# Patient Record
Sex: Male | Born: 1970 | Race: Asian | Hispanic: No | Smoking: Former smoker
Health system: Southern US, Community
[De-identification: ages and names within clinical notes are randomized; demographics above are authoritative.]

## PROBLEM LIST (undated history)

## (undated) DIAGNOSIS — N400 Enlarged prostate without lower urinary tract symptoms: Secondary | ICD-10-CM

## (undated) DIAGNOSIS — E785 Hyperlipidemia, unspecified: Secondary | ICD-10-CM

## (undated) DIAGNOSIS — D693 Immune thrombocytopenic purpura: Secondary | ICD-10-CM

## (undated) DIAGNOSIS — H9190 Unspecified hearing loss, unspecified ear: Secondary | ICD-10-CM

## (undated) HISTORY — PX: HAIR TRANSPLANT: SHX1719

## (undated) HISTORY — DX: Unspecified hearing loss, unspecified ear: H91.90

## (undated) HISTORY — DX: Hyperlipidemia, unspecified: E78.5

## (undated) HISTORY — DX: Immune thrombocytopenic purpura: D69.3

## (undated) HISTORY — PX: INNER EAR SURGERY: SHX679

## (undated) HISTORY — DX: Benign prostatic hyperplasia without lower urinary tract symptoms: N40.0

---

## 2021-11-29 ENCOUNTER — Ambulatory Visit (INDEPENDENT_AMBULATORY_CARE_PROVIDER_SITE_OTHER): Payer: Commercial Managed Care - PPO

## 2021-11-29 ENCOUNTER — Encounter: Payer: Self-pay | Admitting: Nurse Practitioner

## 2021-11-29 ENCOUNTER — Ambulatory Visit (INDEPENDENT_AMBULATORY_CARE_PROVIDER_SITE_OTHER): Payer: Commercial Managed Care - PPO | Admitting: Nurse Practitioner

## 2021-11-29 VITALS — BP 122/80 | HR 81 | Temp 97.6°F | Ht 66.5 in | Wt 143.8 lb

## 2021-11-29 DIAGNOSIS — Z1211 Encounter for screening for malignant neoplasm of colon: Secondary | ICD-10-CM

## 2021-11-29 DIAGNOSIS — M79646 Pain in unspecified finger(s): Secondary | ICD-10-CM

## 2021-11-29 DIAGNOSIS — M25552 Pain in left hip: Secondary | ICD-10-CM

## 2021-11-29 DIAGNOSIS — Z1159 Encounter for screening for other viral diseases: Secondary | ICD-10-CM

## 2021-11-29 DIAGNOSIS — Z13220 Encounter for screening for lipoid disorders: Secondary | ICD-10-CM | POA: Diagnosis not present

## 2021-11-29 DIAGNOSIS — S0101XA Laceration without foreign body of scalp, initial encounter: Secondary | ICD-10-CM | POA: Diagnosis not present

## 2021-11-29 DIAGNOSIS — Z136 Encounter for screening for cardiovascular disorders: Secondary | ICD-10-CM

## 2021-11-29 DIAGNOSIS — N401 Enlarged prostate with lower urinary tract symptoms: Secondary | ICD-10-CM | POA: Insufficient documentation

## 2021-11-29 DIAGNOSIS — R7301 Impaired fasting glucose: Secondary | ICD-10-CM

## 2021-11-29 DIAGNOSIS — R972 Elevated prostate specific antigen [PSA]: Secondary | ICD-10-CM

## 2021-11-29 DIAGNOSIS — R3911 Hesitancy of micturition: Secondary | ICD-10-CM

## 2021-11-29 DIAGNOSIS — Z0001 Encounter for general adult medical examination with abnormal findings: Secondary | ICD-10-CM

## 2021-11-29 DIAGNOSIS — Z114 Encounter for screening for human immunodeficiency virus [HIV]: Secondary | ICD-10-CM

## 2021-11-29 DIAGNOSIS — Z0000 Encounter for general adult medical examination without abnormal findings: Secondary | ICD-10-CM

## 2021-11-29 LAB — CBC WITH DIFFERENTIAL/PLATELET
Basophils Absolute: 0.1 10*3/uL (ref 0.0–0.1)
Basophils Relative: 1 % (ref 0.0–3.0)
Eosinophils Absolute: 0.1 10*3/uL (ref 0.0–0.7)
Eosinophils Relative: 1 % (ref 0.0–5.0)
HCT: 47.7 % (ref 39.0–52.0)
Hemoglobin: 15.4 g/dL (ref 13.0–17.0)
Lymphocytes Relative: 42.6 % (ref 12.0–46.0)
Lymphs Abs: 2.7 10*3/uL (ref 0.7–4.0)
MCHC: 32.2 g/dL (ref 30.0–36.0)
MCV: 75.6 fl — ABNORMAL LOW (ref 78.0–100.0)
Monocytes Absolute: 0.4 10*3/uL (ref 0.1–1.0)
Monocytes Relative: 7 % (ref 3.0–12.0)
Neutro Abs: 3 10*3/uL (ref 1.4–7.7)
Neutrophils Relative %: 48.4 % (ref 43.0–77.0)
Platelets: 591 10*3/uL — ABNORMAL HIGH (ref 150.0–400.0)
RBC: 6.31 Mil/uL — ABNORMAL HIGH (ref 4.22–5.81)
RDW: 15.2 % (ref 11.5–15.5)
WBC: 6.3 10*3/uL (ref 4.0–10.5)

## 2021-11-29 LAB — COMPREHENSIVE METABOLIC PANEL
ALT: 15 U/L (ref 0–53)
AST: 18 U/L (ref 0–37)
Albumin: 4.5 g/dL (ref 3.5–5.2)
Alkaline Phosphatase: 49 U/L (ref 39–117)
BUN: 10 mg/dL (ref 6–23)
CO2: 29 mEq/L (ref 19–32)
Calcium: 9.6 mg/dL (ref 8.4–10.5)
Chloride: 104 mEq/L (ref 96–112)
Creatinine, Ser: 0.97 mg/dL (ref 0.40–1.50)
GFR: 91.41 mL/min (ref >60.00)
Glucose, Bld: 85 mg/dL (ref 70–99)
Potassium: 4.5 mEq/L (ref 3.5–5.1)
Sodium: 140 mEq/L (ref 135–145)
Total Bilirubin: 0.8 mg/dL (ref 0.2–1.2)
Total Protein: 7.5 g/dL (ref 6.0–8.3)

## 2021-11-29 LAB — LIPID PANEL
Cholesterol: 214 mg/dL — ABNORMAL HIGH (ref 0–200)
HDL: 51.5 mg/dL (ref >39.00)
LDL Cholesterol: 125 mg/dL — ABNORMAL HIGH (ref 0–99)
NonHDL: 162.87
Total CHOL/HDL Ratio: 4
Triglycerides: 191 mg/dL — ABNORMAL HIGH (ref 0.0–149.0)
VLDL: 38.2 mg/dL (ref 0.0–40.0)

## 2021-11-29 LAB — HEMOGLOBIN A1C: Hgb A1c MFr Bld: 5.9 % (ref 4.6–6.5)

## 2021-11-29 LAB — PSA: PSA: 5.51 ng/mL — ABNORMAL HIGH (ref 0.10–4.00)
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
## 2021-11-29 NOTE — Addendum Note (Signed)
Addended by: Vance Peper A on: 11/29/2021 01:03 PM   Modules accepted: Orders
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
## 2021-11-29 NOTE — Patient Instructions (Signed)
It was great to see you!  We are checking your labs today and will let you know the results via mychart/phone.   We are checking x-rays.   Let's follow-up in 1 year, sooner if you have concerns.  If a referral was placed today, you will be contacted for an appointment. Please note that routine referrals can sometimes take up to 3-4 weeks to process. Please call our office if you haven't heard anything after this time frame.  Take care,  Vance Peper, NP
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
## 2021-12-02 LAB — HEPATITIS C ANTIBODY
Hepatitis C Ab: NONREACTIVE
SIGNAL TO CUT-OFF: 0.09 (ref <1.00)

## 2021-12-02 LAB — HIV ANTIBODY (ROUTINE TESTING W REFLEX): HIV 1&2 Ab, 4th Generation: NONREACTIVE

## 2021-12-02 NOTE — Addendum Note (Signed)
Addended by: Rodman Pickle A on: 12/02/2021 08:14 AM   Modules accepted: Orders

## 2021-12-27 ENCOUNTER — Encounter: Payer: Self-pay | Admitting: Nurse Practitioner

## 2022-07-07 ENCOUNTER — Ambulatory Visit: Payer: Commercial Managed Care - PPO | Admitting: Family Medicine

## 2022-07-07 ENCOUNTER — Encounter: Payer: Self-pay | Admitting: Family Medicine

## 2022-07-07 VITALS — BP 136/70 | HR 92 | Temp 98.0°F | Ht 60.0 in | Wt 148.0 lb

## 2022-07-07 DIAGNOSIS — B029 Zoster without complications: Secondary | ICD-10-CM | POA: Diagnosis not present

## 2022-07-07 NOTE — Progress Notes (Signed)
  [facility] [address] [place] [address] Dept: [phone] Dept Fax: [phone]  Office Visit  Subjective:    Patient ID: Anthony Bowers, male    DOB: 10-Nov-1970, 52 y.o..   MRN: [id number]  No chief complaint on file.   History of Present Illness:  Patient is in today complaining of a rash that developed on his face starting last Thursday. The day prior he had been doing some outside work around a tree. He thinks there may have been a green vine in the tree and this may have been poison ivy. He has had some itching to the area of the rash. He has been applying Bendaryl lotion to the area. His eye has felt a little irritated.  Past Medical History: Patient Active Problem List   Diagnosis Date Noted   Benign prostatic hyperplasia with urinary hesitancy 11/29/2021   Past Surgical History:  Procedure Laterality Date   HAIR TRANSPLANT     INNER EAR SURGERY     Family History  Problem Relation Age of Onset   Hypertension Mother    Cancer Father        lung   Outpatient Medications Prior to Visit  Medication Sig Dispense Refill   alfuzosin (UROXATRAL) 10 MG 24 hr tablet      finasteride (PROSCAR) 5 MG tablet Take 5 mg by mouth daily.     finasteride (PROPECIA) 1 MG tablet Take 1 mg by mouth daily.     No facility-administered medications prior to visit.   No Known Allergies    Objective:   Today's Vitals   07/07/22 1548  BP: 136/70  Pulse: 92  Temp: 98 F (36.7 C)  TempSrc: Temporal  SpO2: 99%  Weight: 148 lb (67.1 kg)  Height: 5' (1.524 m)   Body mass index is 28.9 kg/m.   General: Well developed, well nourished. No acute distress. HEENT: Mr. Burkey has multiple small papules and crusted vesicles in a left V1 distribution. There are   lesions onto the bridge of the nose and the upper eyelid. The eye is not injected and has no discharge. Psych: Alert and oriented. Normal mood and  affect.  Health Maintenance Due  Topic Date Due   COLONOSCOPY (Pts 45-23yrs Insurance coverage will need to be confirmed)  Never done   Zoster Vaccines- Shingrix (1 of 2) Never done     Assessment & Plan:   1. Herpes zoster without complication Mr. Fiser's rash is classic for shingles involving a V1 distribution pattern for the left trigeminal nerve. As he is presenting after 72 hours form onset of the rash, there would not be benefit for staring an antiviral. he is not having significant pain. He can continue to use the topical Benadryl as needed. I strongly urged him to contact his eye doctor about being seen within the next 1-2 days for an eye examination. We discussed isolation from people who have no history of chickenpox vaccination or disease until all lesions are crusted.   Return for Contact eye doctor today about an examination in the next 24-48 hours.Haydee Salter, MD

## 2022-07-07 NOTE — Patient Instructions (Signed)

## 2022-12-05 ENCOUNTER — Encounter: Payer: Commercial Managed Care - PPO | Admitting: Nurse Practitioner

## 2022-12-24 ENCOUNTER — Encounter: Payer: Commercial Managed Care - PPO | Admitting: Nurse Practitioner

## 2023-01-16 ENCOUNTER — Encounter: Payer: Self-pay | Admitting: Nurse Practitioner

## 2023-01-16 ENCOUNTER — Ambulatory Visit: Payer: Commercial Managed Care - PPO | Admitting: Nurse Practitioner

## 2023-01-16 VITALS — BP 124/80 | HR 68 | Temp 97.6°F | Ht 66.0 in | Wt 148.4 lb

## 2023-01-16 DIAGNOSIS — Z23 Encounter for immunization: Secondary | ICD-10-CM | POA: Diagnosis not present

## 2023-01-16 DIAGNOSIS — N401 Enlarged prostate with lower urinary tract symptoms: Secondary | ICD-10-CM | POA: Diagnosis not present

## 2023-01-16 DIAGNOSIS — R3911 Hesitancy of micturition: Secondary | ICD-10-CM | POA: Diagnosis not present

## 2023-01-16 DIAGNOSIS — R7989 Other specified abnormal findings of blood chemistry: Secondary | ICD-10-CM

## 2023-01-16 DIAGNOSIS — E782 Mixed hyperlipidemia: Secondary | ICD-10-CM | POA: Diagnosis not present

## 2023-01-16 DIAGNOSIS — H60502 Unspecified acute noninfective otitis externa, left ear: Secondary | ICD-10-CM | POA: Diagnosis not present

## 2023-01-16 DIAGNOSIS — Z0000 Encounter for general adult medical examination without abnormal findings: Secondary | ICD-10-CM | POA: Diagnosis not present

## 2023-01-16 LAB — COMPREHENSIVE METABOLIC PANEL
ALT: 15 U/L (ref 0–53)
AST: 18 U/L (ref 0–37)
Albumin: 4.3 g/dL (ref 3.5–5.2)
Alkaline Phosphatase: 55 U/L (ref 39–117)
BUN: 15 mg/dL (ref 6–23)
CO2: 28 mEq/L (ref 19–32)
Calcium: 10 mg/dL (ref 8.4–10.5)
Chloride: 101 mEq/L (ref 96–112)
Creatinine, Ser: 1.1 mg/dL (ref 0.40–1.50)
GFR: 77.98 mL/min (ref >60.00)
Glucose, Bld: 77 mg/dL (ref 70–99)
Potassium: 4.6 mEq/L (ref 3.5–5.1)
Sodium: 138 mEq/L (ref 135–145)
Total Bilirubin: 0.5 mg/dL (ref 0.2–1.2)
Total Protein: 7 g/dL (ref 6.0–8.3)

## 2023-01-16 LAB — CBC WITH DIFFERENTIAL/PLATELET
Basophils Absolute: 0.1 10*3/uL (ref 0.0–0.1)
Basophils Relative: 1.1 % (ref 0.0–3.0)
Eosinophils Absolute: 0.2 10*3/uL (ref 0.0–0.7)
Eosinophils Relative: 2.3 % (ref 0.0–5.0)
HCT: 46.7 % (ref 39.0–52.0)
Hemoglobin: 14.8 g/dL (ref 13.0–17.0)
Lymphocytes Relative: 42.3 % (ref 12.0–46.0)
Lymphs Abs: 3.2 10*3/uL (ref 0.7–4.0)
MCHC: 31.7 g/dL (ref 30.0–36.0)
MCV: 76.6 fl — ABNORMAL LOW (ref 78.0–100.0)
Monocytes Absolute: 0.6 10*3/uL (ref 0.1–1.0)
Monocytes Relative: 8.1 % (ref 3.0–12.0)
Neutro Abs: 3.5 10*3/uL (ref 1.4–7.7)
Neutrophils Relative %: 46.2 % (ref 43.0–77.0)
Platelets: 620 10*3/uL — ABNORMAL HIGH (ref 150.0–400.0)
RBC: 6.09 Mil/uL — ABNORMAL HIGH (ref 4.22–5.81)
RDW: 15.3 % (ref 11.5–15.5)
WBC: 7.6 10*3/uL (ref 4.0–10.5)

## 2023-01-16 LAB — LIPID PANEL
Cholesterol: 235 mg/dL — ABNORMAL HIGH (ref 0–200)
HDL: 34.8 mg/dL — ABNORMAL LOW (ref >39.00)
Total CHOL/HDL Ratio: 7
Triglycerides: 839 mg/dL — ABNORMAL HIGH (ref 0.0–149.0)

## 2023-01-16 LAB — LDL CHOLESTEROL, DIRECT: Direct LDL: 63 mg/dL

## 2023-01-16 LAB — PSA: PSA: 4.23 ng/mL — ABNORMAL HIGH (ref 0.10–4.00)

## 2023-01-16 MED ORDER — OFLOXACIN 0.3 % OT SOLN: 5.0000 [drp] | Freq: Every day | OTIC | 0 refills | Status: DC

## 2023-01-16 NOTE — Addendum Note (Signed)
Addended by: Rodman Pickle A on: 01/16/2023 03:16 PM   Modules accepted: Orders

## 2023-01-16 NOTE — Patient Instructions (Signed)
It was great to see you!  You were referred to [facility]: Phone: [phone]  Start ear drops 5 drops in left ear daily for 7 days.   Let's follow-up in 1 year, sooner if you have concerns.  If a referral was placed today, you will be contacted for an appointment. Please note that routine referrals can sometimes take up to 3-4 weeks to process. Please call our office if you haven't heard anything after this time frame.  Take care,  Rodman Pickle, NP

## 2023-01-16 NOTE — Assessment & Plan Note (Signed)
He is still following with urology and is taking finasteride 5 mg daily.  Will check PSA levels today.  Continue collaboration recommendations from specialist

## 2023-01-16 NOTE — Assessment & Plan Note (Signed)
Health maintenance reviewed and updated. Discussed nutrition, exercise. Follow-up 1 year.

## 2023-01-16 NOTE — Assessment & Plan Note (Signed)
Chronic, ongoing.  Will check CMP, CBC, lipid panel today.  He has started exercising regularly, swimming, and going to the gym.  Treat based on results.

## 2023-01-16 NOTE — Progress Notes (Signed)
BP 124/80 (BP Location: Left Arm)   Pulse 68   Temp 97.6 F (36.4 C)   Ht 5\' 6"  (1.676 m)   Wt 148 lb 6.4 oz (67.3 kg)   SpO2 98%   BMI 23.95 kg/m    Subjective:    Patient ID: Anthony Bowers, male    DOB: 04/24/1971, 52 y.o.   MRN: [id number]  CC: Chief Complaint  Patient presents with   Annual Exam    With fasting lab work    HPI: Anthony Bowers is a 52 y.o. male presenting on 01/16/2023 for comprehensive medical examination. Current medical complaints include: drainage from left ear  He has been experiencing some drainage in his left ear for the last few weeks.  He states that he has been swimming a lot in the pool.  He tried an over-the-counter swimmer's ear medication, however is still ongoing.  He denies pain and fevers.  He currently lives with: mom  Depression and Anxiety Screen done today and results listed below:     01/16/2023    9:51 AM 11/29/2021   10:10 AM  Depression screen PHQ 2/9  Decreased Interest 0 0  Down, Depressed, Hopeless 0 1  PHQ - 2 Score 0 1  Altered sleeping 0   Tired, decreased energy 0   Change in appetite 0   Feeling bad or failure about yourself  0   Trouble concentrating 0   Moving slowly or fidgety/restless 0   Suicidal thoughts 0   PHQ-9 Score 0       01/16/2023    9:51 AM  GAD 7 : Generalized Anxiety Score  Nervous, Anxious, on Edge 0  Control/stop worrying 0  Worry too much - different things 0  Trouble relaxing 0  Restless 0  Easily annoyed or irritable 0  Afraid - awful might happen 0  Total GAD 7 Score 0    The patient does not have a history of falls. I did not complete a risk assessment for falls. A plan of care for falls was not documented.   Past Medical History:  Past Medical History:  Diagnosis Date   Hearing loss    left ear    Surgical History:  Past Surgical History:  Procedure Laterality Date   HAIR TRANSPLANT     INNER EAR SURGERY      Medications:  Current Outpatient Medications on File  Prior to Visit  Medication Sig   finasteride (PROSCAR) 5 MG tablet Take 5 mg by mouth daily.   No current facility-administered medications on file prior to visit.    Allergies:  No Known Allergies  Social History:  Social History   Socioeconomic History   Marital status: Legally Separated    Spouse name: Not on file   Number of children: Not on file   Years of education: Not on file   Highest education level: Not on file  Occupational History   Not on file  Tobacco Use   Smoking status: Former    Current packs/day: 0.00    Types: Cigarettes    Quit date: 2016    Years since quitting: 8.5   Smokeless tobacco: Never  Vaping Use   Vaping status: Never Used  Substance and Sexual Activity   Alcohol use: Yes    Comment: socially   Drug use: Never   Sexual activity: Yes  Other Topics Concern   Not on file  Social History Narrative   Not on file   Social Determinants of  Health   Financial Resource Strain: Not on file  Food Insecurity: Not on file  Transportation Needs: Not on file  Physical Activity: Not on file  Stress: Not on file  Social Connections: Not on file  Intimate Partner Violence: Not on file   Social History   Tobacco Use  Smoking Status Former   Current packs/day: 0.00   Types: Cigarettes   Quit date: 2016   Years since quitting: 8.5  Smokeless Tobacco Never   Social History   Substance and Sexual Activity  Alcohol Use Yes   Comment: socially    Family History:  Family History  Problem Relation Age of Onset   Hypertension Mother    Cancer Father        lung    Past medical history, surgical history, medications, allergies, family history and social history reviewed with patient today and changes made to appropriate areas of the chart.   Review of Systems  Constitutional: Negative.   HENT:  Positive for ear discharge (using OTC ear drops).   Eyes: Negative.   Respiratory: Negative.    Cardiovascular: Negative.    Gastrointestinal: Negative.   Genitourinary:  Negative for dysuria.       Hesitancy, hard to start urine flow  Musculoskeletal: Negative.   Skin: Negative.   Neurological: Negative.   Psychiatric/Behavioral: Negative.     All other ROS negative except what is listed above and in the HPI.      Objective:    BP 124/80 (BP Location: Left Arm)   Pulse 68   Temp 97.6 F (36.4 C)   Ht 5\' 6"  (1.676 m)   Wt 148 lb 6.4 oz (67.3 kg)   SpO2 98%   BMI 23.95 kg/m   Wt Readings from Last 3 Encounters:  01/16/23 148 lb 6.4 oz (67.3 kg)  07/07/22 148 lb (67.1 kg)  11/29/21 143 lb 12.8 oz (65.2 kg)    Physical Exam Vitals and nursing note reviewed.  Constitutional:      General: He is not in acute distress.    Appearance: Normal appearance.  HENT:     Head: Normocephalic and atraumatic.     Right Ear: Tympanic membrane, ear canal and external ear normal.     Left Ear: External ear normal. Drainage present.     Ears:     Comments: Redness to your canal Eyes:     Conjunctiva/sclera: Conjunctivae normal.  Cardiovascular:     Rate and Rhythm: Normal rate and regular rhythm.     Pulses: Normal pulses.     Heart sounds: Normal heart sounds.  Pulmonary:     Effort: Pulmonary effort is normal.     Breath sounds: Normal breath sounds.  Abdominal:     Palpations: Abdomen is soft.     Tenderness: There is no abdominal tenderness.  Musculoskeletal:        General: Normal range of motion.     Cervical back: Normal range of motion and neck supple. No tenderness.     Right lower leg: No edema.     Left lower leg: No edema.  Lymphadenopathy:     Cervical: No cervical adenopathy.  Skin:    General: Skin is warm and dry.  Neurological:     General: No focal deficit present.     Mental Status: He is alert and oriented to person, place, and time.     Cranial Nerves: No cranial nerve deficit.     Coordination: Coordination normal.  Gait: Gait normal.  Psychiatric:        Mood and  Affect: Mood normal.        Behavior: Behavior normal.        Thought Content: Thought content normal.        Judgment: Judgment normal.     Results for orders placed or performed in visit on 11/29/21  CBC with Differential/Platelet  Result Value Ref Range   WBC 6.3 4.0 - 10.5 K/uL   RBC 6.31 (H) 4.22 - 5.81 Mil/uL   Hemoglobin 15.4 13.0 - 17.0 g/dL   HCT 70.6 23.7 - 62.8 %   MCV 75.6 (L) 78.0 - 100.0 fl   MCHC 32.2 30.0 - 36.0 g/dL   RDW 31.5 17.6 - 16.0 %   Platelets 591.0 (H) 150.0 - 400.0 K/uL   Neutrophils Relative % 48.4 43.0 - 77.0 %   Lymphocytes Relative 42.6 12.0 - 46.0 %   Monocytes Relative 7.0 3.0 - 12.0 %   Eosinophils Relative 1.0 0.0 - 5.0 %   Basophils Relative 1.0 0.0 - 3.0 %   Neutro Abs 3.0 1.4 - 7.7 K/uL   Lymphs Abs 2.7 0.7 - 4.0 K/uL   Monocytes Absolute 0.4 0.1 - 1.0 K/uL   Eosinophils Absolute 0.1 0.0 - 0.7 K/uL   Basophils Absolute 0.1 0.0 - 0.1 K/uL  Comprehensive metabolic panel  Result Value Ref Range   Sodium 140 135 - 145 mEq/L   Potassium 4.5 3.5 - 5.1 mEq/L   Chloride 104 96 - 112 mEq/L   CO2 29 19 - 32 mEq/L   Glucose, Bld 85 70 - 99 mg/dL   BUN 10 6 - 23 mg/dL   Creatinine, Ser 7.37 0.40 - 1.50 mg/dL   Total Bilirubin 0.8 0.2 - 1.2 mg/dL   Alkaline Phosphatase 49 39 - 117 U/L   AST 18 0 - 37 U/L   ALT 15 0 - 53 U/L   Total Protein 7.5 6.0 - 8.3 g/dL   Albumin 4.5 3.5 - 5.2 g/dL   GFR 10.62 >69.48 mL/min   Calcium 9.6 8.4 - 10.5 mg/dL  Hepatitis C antibody  Result Value Ref Range   Hepatitis C Ab NON-REACTIVE NON-REACTIVE   SIGNAL TO CUT-OFF 0.09 <1.00  HIV Antibody (routine testing w rflx)  Result Value Ref Range   HIV 1&2 Ab, 4th Generation NON-REACTIVE NON-REACTIVE  Lipid panel  Result Value Ref Range   Cholesterol 214 (H) 0 - 200 mg/dL   Triglycerides 546.2 (H) 0.0 - 149.0 mg/dL   HDL 70.35 >00.93 mg/dL   VLDL 81.8 0.0 - 29.9 mg/dL   LDL Cholesterol 371 (H) 0 - 99 mg/dL   Total CHOL/HDL Ratio 4    NonHDL 162.87    Hemoglobin A1c  Result Value Ref Range   Hgb A1c MFr Bld 5.9 4.6 - 6.5 %  PSA  Result Value Ref Range   PSA 5.51 (H) 0.10 - 4.00 ng/mL      Assessment & Plan:   Problem List Items Addressed This Visit       Genitourinary   Benign prostatic hyperplasia with urinary hesitancy    He is still following with urology and is taking finasteride 5 mg daily.  Will check PSA levels today.  Continue collaboration recommendations from specialist      Relevant Orders   PSA     Other   Mixed hyperlipidemia    Chronic, ongoing.  Will check CMP, CBC, lipid panel today.  He has started  exercising regularly, swimming, and going to the gym.  Treat based on results.      Relevant Orders   CBC with Differential/Platelet   Comprehensive metabolic panel   Lipid panel   Routine general medical examination at a health care facility - Primary    Health maintenance reviewed and updated. Discussed nutrition, exercise.  Follow-up 1 year.        Other Visit Diagnoses     Immunization due       Shingrix No. 1 given today.  He will come back in 2 to 6 months for nurse visit for second dose.   Relevant Orders   Varicella-zoster vaccine IM (Completed)   Acute otitis externa of left ear, unspecified type       Will treat with Cipro eardrops 5 drops daily.  Encouraged him to wear earplugs or cap when swimming.  Follow-up if not improving        LABORATORY TESTING:  Health maintenance labs ordered today as discussed above.   The natural history of prostate cancer and ongoing controversy regarding screening and potential treatment outcomes of prostate cancer has been discussed with the patient. The meaning of a false positive PSA and a false negative PSA has been discussed. He indicates understanding of the limitations of this screening test and wishes to proceed with screening PSA testing.   IMMUNIZATIONS:   - Tdap: Tetanus vaccination status reviewed: last tetanus booster within 10 years. -  Influenza: Postponed to flu season - Pneumovax: Not applicable - Prevnar: Not applicable - HPV: Not applicable - Zostavax vaccine: Administered today  SCREENING: - Colonoscopy: Ordered today  Discussed with patient purpose of the colonoscopy is to detect colon cancer at curable precancerous or early stages   - AAA Screening: Not applicable   PATIENT COUNSELING:    Sexuality: Discussed sexually transmitted diseases, partner selection, use of condoms, avoidance of unintended pregnancy  and contraceptive alternatives.   Advised to avoid cigarette smoking.  I discussed with the patient that most people either abstain from alcohol or drink within safe limits (<=14/week and <=4 drinks/occasion for males, <=7/weeks and <= 3 drinks/occasion for females) and that the risk for alcohol disorders and other health effects rises proportionally with the number of drinks per week and how often a drinker exceeds daily limits.  Discussed cessation/primary prevention of drug use and availability of treatment for abuse.   Diet: Encouraged to adjust caloric intake to maintain  or achieve ideal body weight, to reduce intake of dietary saturated fat and total fat, to limit sodium intake by avoiding high sodium foods and not adding table salt, and to maintain adequate dietary potassium and calcium preferably from fresh fruits, vegetables, and low-fat dairy products.    stressed the importance of regular exercise  Injury prevention: Discussed safety belts, safety helmets, smoke detector, smoking near bedding or upholstery.   Dental health: Discussed importance of regular tooth brushing, flossing, and dental visits.   Follow up plan: NEXT PREVENTATIVE PHYSICAL DUE IN 1 YEAR. Return in about 1 year (around 01/16/2024) for CPE.

## 2023-01-26 ENCOUNTER — Encounter: Payer: Self-pay | Admitting: Nurse Practitioner

## 2023-01-26 NOTE — Telephone Encounter (Signed)
Pt called and wanted to know if he should still go to the hemotology or wait

## 2023-02-05 ENCOUNTER — Encounter (INDEPENDENT_AMBULATORY_CARE_PROVIDER_SITE_OTHER): Payer: Self-pay

## 2023-02-06 ENCOUNTER — Other Ambulatory Visit (INDEPENDENT_AMBULATORY_CARE_PROVIDER_SITE_OTHER): Payer: Commercial Managed Care - PPO

## 2023-02-06 DIAGNOSIS — E782 Mixed hyperlipidemia: Secondary | ICD-10-CM

## 2023-02-06 LAB — LIPID PANEL
Cholesterol: 256 mg/dL — ABNORMAL HIGH (ref 0–200)
HDL: 39.8 mg/dL (ref >39.00)
Total CHOL/HDL Ratio: 6
Triglycerides: 405 mg/dL — ABNORMAL HIGH (ref 0.0–149.0)

## 2023-02-06 LAB — LDL CHOLESTEROL, DIRECT: Direct LDL: 128 mg/dL

## 2023-02-09 MED ORDER — ROSUVASTATIN CALCIUM 20 MG PO TABS: 20.0000 mg | ORAL_TABLET | Freq: Every day | ORAL | 2 refills | Status: DC

## 2023-02-18 ENCOUNTER — Inpatient Hospital Stay: Payer: Commercial Managed Care - PPO | Attending: Hematology | Admitting: Hematology

## 2023-02-18 ENCOUNTER — Encounter: Payer: Self-pay | Admitting: Hematology

## 2023-02-18 ENCOUNTER — Inpatient Hospital Stay: Payer: Commercial Managed Care - PPO

## 2023-02-18 VITALS — BP 130/92 | HR 67 | Temp 98.1°F | Resp 16 | Ht 66.0 in | Wt 144.8 lb

## 2023-02-18 DIAGNOSIS — R718 Other abnormality of red blood cells: Secondary | ICD-10-CM | POA: Diagnosis not present

## 2023-02-18 DIAGNOSIS — Z87891 Personal history of nicotine dependence: Secondary | ICD-10-CM | POA: Diagnosis not present

## 2023-02-18 DIAGNOSIS — D75839 Thrombocytosis, unspecified: Secondary | ICD-10-CM | POA: Diagnosis present

## 2023-02-18 DIAGNOSIS — D473 Essential (hemorrhagic) thrombocythemia: Secondary | ICD-10-CM | POA: Insufficient documentation

## 2023-02-18 LAB — VITAMIN B12: Vitamin B-12: 434 pg/mL (ref 180–914)

## 2023-02-18 LAB — CBC WITH DIFFERENTIAL (CANCER CENTER ONLY)
Abs Immature Granulocytes: 0.02 K/uL (ref 0.00–0.07)
Basophils Absolute: 0.1 K/uL (ref 0.0–0.1)
Basophils Relative: 1 %
Eosinophils Absolute: 0.1 K/uL (ref 0.0–0.5)
Eosinophils Relative: 2 %
HCT: 45.7 % (ref 39.0–52.0)
Hemoglobin: 15.1 g/dL (ref 13.0–17.0)
Immature Granulocytes: 0 %
Lymphocytes Relative: 49 %
Lymphs Abs: 3.9 K/uL (ref 0.7–4.0)
MCH: 24.6 pg — ABNORMAL LOW (ref 26.0–34.0)
MCHC: 33 g/dL (ref 30.0–36.0)
MCV: 74.3 fL — ABNORMAL LOW (ref 80.0–100.0)
Monocytes Absolute: 0.6 K/uL (ref 0.1–1.0)
Monocytes Relative: 8 %
Neutro Abs: 3.1 K/uL (ref 1.7–7.7)
Neutrophils Relative %: 40 %
Platelet Count: 592 K/uL — ABNORMAL HIGH (ref 150–400)
RBC: 6.15 MIL/uL — ABNORMAL HIGH (ref 4.22–5.81)
RDW: 14.5 % (ref 11.5–15.5)
WBC Count: 7.8 K/uL (ref 4.0–10.5)
nRBC: 0 % (ref 0.0–0.2)

## 2023-02-18 NOTE — Progress Notes (Signed)
[facility]   Telephone:(336) [phone] Fax:(336) [phone]   Clinic New Consult Note   Patient Care Team: Gerre Scull, NP as PCP - General (Internal Medicine) Gerre Scull, NP as Nurse Practitioner (Internal Medicine) 02/18/2023  CHIEF COMPLAINTS/PURPOSE OF CONSULTATION:  Thrombocytosis  Referring physician: Rodman Pickle  HISTORY OF PRESENTING ILLNESS:  Anthony Bowers 52 y.o. male is here because of thrombocytosis.  He was referred by his PCP.  He presents to the clinic by himself.  This was discovered on routine lab tests.  His CBC from June 2023 showed WBC 6.3, hemoglobin 15.4, MCV 75.6, platelet 591K.  Repeated CBC in July 2024 showed platelet 620K.  He does have seasonal allergies, does not smoke.  His previous lab before 2023, not available, but he is not aware of history of thrombocytosis.  He was taking over-the-counter iron pills for a while due to iron deficiency, but no anemia in the past.  He denies any hematochezia or other signs of bleeding.  His past medical history is significant for hearing loss.  His PSA has been slightly elevated in the past 2 years, and he has appointment with [facility] in the near future.  No history of cancer.  He overall feels well, denies any pain or other new symptoms.  He has normal energy and appetite.  He quit smoking several years ago, and has gained some weight since then.  He exercises 2-3 times per week.  MEDICAL HISTORY:  Past Medical History:  Diagnosis Date   Hearing loss    left ear    SURGICAL HISTORY: Past Surgical History:  Procedure Laterality Date   HAIR TRANSPLANT     INNER EAR SURGERY      SOCIAL HISTORY: Social History   Socioeconomic History   Marital status: Legally Separated    Spouse name: Not on file   Number of children: 1   Years of education: Not on file   Highest education level: Not on file  Occupational History   Not on file  Tobacco Use   Smoking status: Former     Current packs/day: 0.00    Types: Cigarettes    Quit date: 2016    Years since quitting: 8.6   Smokeless tobacco: Never  Vaping Use   Vaping status: Never Used  Substance and Sexual Activity   Alcohol use: Yes    Comment: socially   Drug use: Never   Sexual activity: Yes  Other Topics Concern   Not on file  Social History Narrative   Not on file   Social Determinants of Health   Financial Resource Strain: Not on file  Food Insecurity: Not on file  Transportation Needs: Not on file  Physical Activity: Not on file  Stress: Not on file  Social Connections: Not on file  Intimate Partner Violence: Not on file    FAMILY HISTORY: Family History  Problem Relation Age of Onset   Hypertension Mother    Cancer Father        lung    ALLERGIES:  has No Known Allergies.  MEDICATIONS:  Current Outpatient Medications  Medication Sig Dispense Refill   finasteride (PROSCAR) 5 MG tablet Take 5 mg by mouth daily.     ofloxacin (FLOXIN) 0.3 % OTIC solution Place 5 drops into the left ear daily. 5 mL 0   rosuvastatin (CRESTOR) 20 MG tablet Take 1 tablet (20 mg total) by mouth daily. 30 tablet 2   No current facility-administered medications for this  visit.    REVIEW OF SYSTEMS:   Constitutional: Denies fevers, chills or abnormal night sweats Eyes: Denies blurriness of vision, double vision or watery eyes Ears, nose, mouth, throat, and face: Denies mucositis or sore throat Respiratory: Denies cough, dyspnea or wheezes Cardiovascular: Denies palpitation, chest discomfort or lower extremity swelling Gastrointestinal:  Denies nausea, heartburn or change in bowel habits Skin: Denies abnormal skin rashes Lymphatics: Denies new lymphadenopathy or easy bruising Neurological:Denies numbness, tingling or new weaknesses Behavioral/Psych: Mood is stable, no new changes  All other systems were reviewed with the patient and are negative.  PHYSICAL EXAMINATION:  Vitals:   02/18/23 1510   BP: (!) 130/92  Pulse: 67  Resp: 16  Temp: 98.1 F (36.7 C)  SpO2: 98%   Filed Weights   02/18/23 1510  Weight: 144 lb 12.8 oz (65.7 kg)    GENERAL:alert, no distress and comfortable SKIN: skin color, texture, turgor are normal, no rashes or significant lesions EYES: normal, conjunctiva are pink and non-injected, sclera clear OROPHARYNX:no exudate, no erythema and lips, buccal mucosa, and tongue normal  NECK: supple, thyroid normal size, non-tender, without nodularity LYMPH:  no palpable lymphadenopathy in the cervical, axillary or inguinal LUNGS: clear to auscultation and percussion with normal breathing effort HEART: regular rate & rhythm and no murmurs and no lower extremity edema ABDOMEN:abdomen soft, non-tender and normal bowel sounds Musculoskeletal:no cyanosis of digits and no clubbing  PSYCH: alert & oriented x 3 with fluent speech NEURO: no focal motor/sensory deficits  LABORATORY DATA:  I have reviewed the data as listed    Latest Ref Rng & Units 02/18/2023    3:50 PM 01/16/2023   10:24 AM 11/29/2021   10:57 AM  CBC  WBC 4.0 - 10.5 K/uL 7.8  7.6  6.3   Hemoglobin 13.0 - 17.0 g/dL 60.4  54.0  98.1   Hematocrit 39.0 - 52.0 % 45.7  46.7  47.7   Platelets 150 - 400 K/uL 592  620.0  591.0     RADIOGRAPHIC STUDIES: I have personally reviewed the radiological images as listed and agreed with the findings in the report. No results found.  ASSESSMENT & PLAN: 52 yo male   Thrombocytosis, rule out essential thrombocythemia -Discovered on routine physical exam and lab test since 2023, platelet has been around 600 K -No personal or family history of thrombosis, stroke or heart attack. -I discussed potential etiology of thrombocytosis, including reactive change, especially from iron deficiency, chronic inflammation, malignancy, allergy, or MPN/essential thrombocythemia.  He does not have elevated WBC or hemoglobin. -Will obtain lab test today for iron deficiency, B12, and  MPN panel -If the above test results are negative, I recommend clinical monitoring.  ET may not be able to compare without dealing with negative genetic panel, I do not feel strongly he needs a bone marrow biopsy, given his low risk of thrombosis and no need for treatment.  I discussed above with patient in detail.  2. Microcytosis -He has a mild macrocytosis, I will obtain iron study and hemoglobin electrophoresis to rule out a thrombocytopenia  Plan -lab today -I will call him in 2 to 3 weeks to review the above lab results.    Orders Placed This Encounter  Procedures   CBC with Differential (Cancer Center Only)    Standing Status:   Future    Number of Occurrences:   1    Standing Expiration Date:   02/18/2024   Ferritin    Standing Status:   Future  Number of Occurrences:   1    Standing Expiration Date:   02/18/2024   Vitamin B12    Standing Status:   Future    Number of Occurrences:   1    Standing Expiration Date:   02/18/2024   Hgb Fractionation Cascade    Standing Status:   Future    Number of Occurrences:   1    Standing Expiration Date:   02/18/2024   JAK2 (INCLUDING V617F AND EXON 12), MPL,& CALR W/RFL MPN PANEL (NGS)    Standing Status:   Future    Number of Occurrences:   1    Standing Expiration Date:   02/18/2024    All questions were answered. The patient knows to call the clinic with any problems, questions or concerns. I spent 25 minutes counseling the patient face to face. The total time spent in the appointment was 30 minutes and more than 50% was on counseling.     Malachy Mood, MD 02/18/2023

## 2023-02-19 ENCOUNTER — Telehealth: Payer: Self-pay | Admitting: Hematology

## 2023-02-19 LAB — FERRITIN: Ferritin: 117 ng/mL (ref 24–336)

## 2023-02-24 LAB — JAK2 (INCLUDING V617F AND EXON 12), MPL,& CALR W/RFL MPN PANEL (NGS)

## 2023-02-24 LAB — HGB FRACTIONATION CASCADE: Hgb A2: 3.4 % — ABNORMAL HIGH (ref 1.8–3.2)

## 2023-02-24 LAB — HGB FRACTIONATION BY HPLC
Hgb A: 67.7 % — ABNORMAL LOW (ref 96.4–98.8)
Hgb C: 0 %
Hgb E: 28.9 % — ABNORMAL HIGH
Hgb F: 0 % (ref 0.0–2.0)
Hgb S: 0 %
Hgb Variant: 0 %

## 2023-02-25 ENCOUNTER — Telehealth: Payer: Self-pay | Admitting: Hematology

## 2023-03-03 ENCOUNTER — Inpatient Hospital Stay: Payer: Commercial Managed Care - PPO | Attending: Hematology | Admitting: Hematology

## 2023-03-03 DIAGNOSIS — D582 Other hemoglobinopathies: Secondary | ICD-10-CM | POA: Diagnosis not present

## 2023-03-03 DIAGNOSIS — D473 Essential (hemorrhagic) thrombocythemia: Secondary | ICD-10-CM

## 2023-03-03 NOTE — Assessment & Plan Note (Signed)
-  Discovered on routine physical exam and lab test since 2023, platelet has been around 600 K -No personal or family history of thrombosis, stroke or heart attack. -Molecular testing of MPN panel showed positive JAK2 V617Fmutation, which supports ET -Due to his young age and mild thrombocytosis, no previous history of thrombosis, I do not recommend treatment with Hydrea for now.  Will continue monitoring, I recommend him to start aspirin 81 mg daily.

## 2023-03-03 NOTE — Progress Notes (Signed)
[facility]   Telephone:(336) [phone] Fax:(336) [phone]   Clinic Follow up Note   Patient Care Team: Gerre Scull, NP as PCP - General (Internal Medicine) Gerre Scull, NP as Nurse Practitioner (Internal Medicine)  Date of Service:  03/03/2023  I connected with [name] on 03/03/2023 at  2:40 PM EDT by telephone visit and verified that I am speaking with the correct person using two identifiers.  I discussed the limitations, risks, security and privacy concerns of performing an evaluation and management service by telephone and the availability of in person appointments. I also discussed with the patient that there may be a patient responsible charge related to this service. The patient expressed understanding and agreed to proceed.   Other persons participating in the visit and their role in the encounter:  No  Patient's location:  Home Provider's location:  [facility] Office  CHIEF COMPLAINT: f/u of Thrombocytosis   CURRENT THERAPY:    ASSESSMENT & PLAN:  Anthony Bowers is a 52 y.o. male with     Essential thrombocythemia (HCC) -Discovered on routine physical exam and lab test since 2023, platelet has been around 600 K -No personal or family history of thrombosis, stroke or heart attack. -Molecular testing of MPN panel showed positive JAK2 V617Fmutation, which supports ET -Due to his young age and mild thrombocytosis, no previous history of thrombosis, I do not recommend treatment with Hydrea for now.  Will continue monitoring, I recommend him to start aspirin 81 mg daily.  Hemoglobin E trait (HCC) -Due to his microcytosis, I obtained hemoglobin electrophoresis in August 2024, it showed hemoglobin E heterozygous.  He is not anemic.  I discussed results with him, no treatment needed. -I encouraged his children to be screened.  PLAN: - I discuss HEMOGLOBIN E trait w/ pt - I Pt has JAK 2 mutation , blood disorder call ET, no treatment is need at  this time. -I recommend taking 81 mg baby Asprin -I recommend pt to have platelet count monitored  q4-6 months -Pt to f/u with me  if platelet count is above 800 -lab and f/u in January 2025, then once a year after next visit      INTERVAL HISTORY:  [name] was contacted for a follow up of Thrombocytosis . He was last seen by me on 02/18/2023.   All other systems were reviewed with the patient and are negative.  MEDICAL HISTORY:  Past Medical History:  Diagnosis Date   Hearing loss    left ear    SURGICAL HISTORY: Past Surgical History:  Procedure Laterality Date   HAIR TRANSPLANT     INNER EAR SURGERY      I have reviewed the social history and family history with the patient and they are unchanged from previous note.  ALLERGIES:  has No Known Allergies.  MEDICATIONS:  Current Outpatient Medications  Medication Sig Dispense Refill   finasteride (PROSCAR) 5 MG tablet Take 5 mg by mouth daily.     ofloxacin (FLOXIN) 0.3 % OTIC solution Place 5 drops into the left ear daily. 5 mL 0   rosuvastatin (CRESTOR) 20 MG tablet Take 1 tablet (20 mg total) by mouth daily. 30 tablet 2   No current facility-administered medications for this visit.    PHYSICAL EXAMINATION: ECOG PERFORMANCE STATUS: 0 - Asymptomatic  There were no vitals filed for this visit. Wt Readings from Last 3 Encounters:  02/18/23 144 lb 12.8 oz (65.7 kg)  01/16/23 148 lb 6.4 oz (  67.3 kg)  07/07/22 148 lb (67.1 kg)     No vitals taken today, Exam not performed today  LABORATORY DATA:  I have reviewed the data as listed    Latest Ref Rng & Units 02/18/2023    3:50 PM 01/16/2023   10:24 AM 11/29/2021   10:57 AM  CBC  WBC 4.0 - 10.5 K/uL 7.8  7.6  6.3   Hemoglobin 13.0 - 17.0 g/dL 16.1  09.6  04.5   Hematocrit 39.0 - 52.0 % 45.7  46.7  47.7   Platelets 150 - 400 K/uL 592  620.0  591.0         Latest Ref Rng & Units 01/16/2023   10:24 AM 11/29/2021   10:57 AM  CMP  Glucose 70 - 99 mg/dL 77   85   BUN 6 - 23 mg/dL 15  10   Creatinine 4.09 - 1.50 mg/dL 8.11  9.14   Sodium 782 - 145 mEq/L 138  140   Potassium 3.5 - 5.1 mEq/L 4.6  4.5   Chloride 96 - 112 mEq/L 101  104   CO2 19 - 32 mEq/L 28  29   Calcium 8.4 - 10.5 mg/dL 95.6  9.6   Total Protein 6.0 - 8.3 g/dL 7.0  7.5   Total Bilirubin 0.2 - 1.2 mg/dL 0.5  0.8   Alkaline Phos 39 - 117 U/L 55  49   AST 0 - 37 U/L 18  18   ALT 0 - 53 U/L 15  15       RADIOGRAPHIC STUDIES: I have personally reviewed the radiological images as listed and agreed with the findings in the report. No results found.    No orders of the defined types were placed in this encounter.  All questions were answered. The patient knows to call the clinic with any problems, questions or concerns. No barriers to learning was detected. The total time spent in the appointment was 22 minutes.     Malachy Mood, MD 03/03/2023   Carolin Coy am acting as scribe for Malachy Mood, MD.   I have reviewed the above documentation for accuracy and completeness, and I agree with the above.

## 2023-03-03 NOTE — Assessment & Plan Note (Signed)
-  Due to his microcytosis, I obtained hemoglobin electrophoresis in August 2024, it showed hemoglobin E heterozygous.  He is not anemic.  I discussed results with him, no treatment needed. -I encouraged his children to be screened.

## 2023-03-13 LAB — PSA: PSA: 2.82

## 2023-03-19 ENCOUNTER — Ambulatory Visit: Payer: Commercial Managed Care - PPO

## 2023-03-23 ENCOUNTER — Encounter: Payer: Self-pay | Admitting: Nurse Practitioner

## 2023-04-25 ENCOUNTER — Other Ambulatory Visit: Payer: Self-pay | Admitting: Nurse Practitioner

## 2023-05-08 ENCOUNTER — Ambulatory Visit: Payer: Commercial Managed Care - PPO | Admitting: Nurse Practitioner

## 2023-05-08 ENCOUNTER — Telehealth: Payer: Self-pay | Admitting: Nurse Practitioner

## 2023-05-08 NOTE — Telephone Encounter (Signed)
11.15.24 no show no letter sent

## 2023-05-12 NOTE — Telephone Encounter (Signed)
 same day cancel, 1st missed visit, letter sent via [facility]

## 2023-05-13 NOTE — Telephone Encounter (Signed)
Noted  
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
## 2023-05-29 ENCOUNTER — Encounter: Payer: Self-pay | Admitting: Nurse Practitioner

## 2023-05-29 ENCOUNTER — Ambulatory Visit: Payer: Commercial Managed Care - PPO | Admitting: Nurse Practitioner

## 2023-05-29 VITALS — BP 132/82 | HR 76 | Temp 97.8°F | Ht 66.0 in | Wt 146.0 lb

## 2023-05-29 DIAGNOSIS — E782 Mixed hyperlipidemia: Secondary | ICD-10-CM

## 2023-05-29 DIAGNOSIS — N401 Enlarged prostate with lower urinary tract symptoms: Secondary | ICD-10-CM | POA: Diagnosis not present

## 2023-05-29 DIAGNOSIS — D473 Essential (hemorrhagic) thrombocythemia: Secondary | ICD-10-CM

## 2023-05-29 DIAGNOSIS — R3911 Hesitancy of micturition: Secondary | ICD-10-CM

## 2023-05-29 LAB — COMPREHENSIVE METABOLIC PANEL
ALT: 16 U/L (ref 0–53)
AST: 21 U/L (ref 0–37)
Albumin: 4.3 g/dL (ref 3.5–5.2)
Alkaline Phosphatase: 53 U/L (ref 39–117)
BUN: 14 mg/dL (ref 6–23)
CO2: 29 meq/L (ref 19–32)
Calcium: 9.3 mg/dL (ref 8.4–10.5)
Chloride: 103 meq/L (ref 96–112)
Creatinine, Ser: 1.02 mg/dL (ref 0.40–1.50)
GFR: 85.16 mL/min (ref >60.00)
Glucose, Bld: 88 mg/dL (ref 70–99)
Potassium: 3.9 meq/L (ref 3.5–5.1)
Sodium: 138 meq/L (ref 135–145)
Total Bilirubin: 0.6 mg/dL (ref 0.2–1.2)
Total Protein: 7.4 g/dL (ref 6.0–8.3)

## 2023-05-29 LAB — CBC WITH DIFFERENTIAL/PLATELET
Basophils Absolute: 0.1 10*3/uL (ref 0.0–0.1)
Basophils Relative: 0.7 % (ref 0.0–3.0)
Eosinophils Absolute: 0.1 10*3/uL (ref 0.0–0.7)
Eosinophils Relative: 1.2 % (ref 0.0–5.0)
HCT: 46.6 % (ref 39.0–52.0)
Hemoglobin: 15.6 g/dL (ref 13.0–17.0)
Lymphocytes Relative: 38.3 % (ref 12.0–46.0)
Lymphs Abs: 2.9 10*3/uL (ref 0.7–4.0)
MCHC: 33.4 g/dL (ref 30.0–36.0)
MCV: 77.2 fL — ABNORMAL LOW (ref 78.0–100.0)
Monocytes Absolute: 0.6 10*3/uL (ref 0.1–1.0)
Monocytes Relative: 8.4 % (ref 3.0–12.0)
Neutro Abs: 4 10*3/uL (ref 1.4–7.7)
Neutrophils Relative %: 51.4 % (ref 43.0–77.0)
Platelets: 618 10*3/uL — ABNORMAL HIGH (ref 150.0–400.0)
RBC: 6.04 Mil/uL — ABNORMAL HIGH (ref 4.22–5.81)
RDW: 15 % (ref 11.5–15.5)
WBC: 7.7 10*3/uL (ref 4.0–10.5)

## 2023-05-29 LAB — LIPID PANEL
Cholesterol: 148 mg/dL (ref 0–200)
HDL: 41.7 mg/dL (ref >39.00)
LDL Cholesterol: 69 mg/dL (ref 0–99)
NonHDL: 105.92
Total CHOL/HDL Ratio: 4
Triglycerides: 184 mg/dL — ABNORMAL HIGH (ref 0.0–149.0)
VLDL: 36.8 mg/dL (ref 0.0–40.0)

## 2023-05-29 MED ORDER — ROSUVASTATIN CALCIUM 20 MG PO TABS: 20.0000 mg | ORAL_TABLET | Freq: Every day | ORAL | 0 refills | Status: DC

## 2023-05-29 NOTE — Patient Instructions (Signed)
It was great to see you!  We are checking your labs today and will let you know the results via mychart/phone.   I have refilled your crestor to [facility], let us know when you want me to send it to [facility]   Call and schedule a nurse visit for your second shingles vaccine  Let's follow-up in 8 months, sooner if you have concerns.  If a referral was placed today, you will be contacted for an appointment. Please note that routine referrals can sometimes take up to 3-4 weeks to process. Please call our office if you haven't heard anything after this time frame.  Take care,  Rodman Pickle, NP

## 2023-05-29 NOTE — Assessment & Plan Note (Signed)
Continue taking aspirin 81mg  daily and follow-up with hematology as scheduled.

## 2023-05-29 NOTE — Assessment & Plan Note (Signed)
Recent biopsy was negative. Finasteride 5mg  daily and Tadalafil 5mg  daily have been started by urology, improving urinary symptoms. He will continue his current medications and have an annual follow-up with his urologist.

## 2023-05-29 NOTE — Assessment & Plan Note (Signed)
He has been regularly taking Rosuvastatin 20mg  daily (Crestor) but expressed concerns about the cost of the medication. We will refill the Rosuvastatin prescription to [facility], provide him with a coupon to reduce the cost, and check CMP, CBC, lipid panel today.

## 2023-05-29 NOTE — Progress Notes (Signed)
Established Patient Office Visit  Subjective   Patient ID: Anthony Bowers, male    DOB: 07/24/1970  Age: 52 y.o. MRN: [id number]  Chief Complaint  Patient presents with   Hyperlipidemia    Follow up from previous lab work    HPI  Discussed the use of AI scribe software for clinical note transcription with the patient, who gave verbal consent to proceed.  History of Present Illness   The patient, with a history of prostate issues, recently underwent a prostate biopsy, which thankfully returned negative results. He has been prescribed finasteride and tadalafil to manage the size of his prostate and improve urinary symptoms, respectively. The patient reports significant improvement in urinary symptoms with these medications, allowing him to increase his fluid intake without discomfort.  The patient also has a history of high cholesterol, for which he is taking rosuvastatin. He expresses concern about the cost of this medication, as the price has increased from ten to twenty-seven dollars. He is considering switching to a [employer], [employer], to reduce the cost. He denies chest pain and shortness of breath.   Another concern is his platelet count. He has been advised by hematology to take low-dose aspirin daily due to a higher than normal platelet count, which is reportedly common in his ethnic group. He has a follow-up blood test scheduled in January to monitor this.       ROS See pertinent positives and negatives per HPI.    Objective:     BP 132/82 (BP Location: Left Arm, Cuff Size: Normal)   Pulse 76   Temp 97.8 F (36.6 C)   Ht 5\' 6"  (1.676 m)   Wt 146 lb (66.2 kg)   SpO2 99%   BMI 23.57 kg/m    Physical Exam Vitals and nursing note reviewed.  Constitutional:      Appearance: Normal appearance.  HENT:     Head: Normocephalic.  Eyes:     Conjunctiva/sclera: Conjunctivae normal.  Cardiovascular:     Rate and Rhythm: Normal rate and regular rhythm.      Pulses: Normal pulses.     Heart sounds: Normal heart sounds.  Pulmonary:     Effort: Pulmonary effort is normal.     Breath sounds: Normal breath sounds.  Musculoskeletal:     Cervical back: Normal range of motion.  Skin:    General: Skin is warm.  Neurological:     General: No focal deficit present.     Mental Status: He is alert and oriented to person, place, and time.  Psychiatric:        Mood and Affect: Mood normal.        Behavior: Behavior normal.        Thought Content: Thought content normal.        Judgment: Judgment normal.    The 10-year ASCVD risk score (Arnett DK, et al., 2019) is: 7.1%    Assessment & Plan:   Problem List Items Addressed This Visit       Genitourinary   Benign prostatic hyperplasia with urinary hesitancy    Recent biopsy was negative. Finasteride 5mg  daily and Tadalafil 5mg  daily have been started by urology, improving urinary symptoms. He will continue his current medications and have an annual follow-up with his urologist.        Hematopoietic and Hemostatic   Essential thrombocythemia (HCC)    Continue taking aspirin 81mg  daily and follow-up with hematology as scheduled.       Relevant Medications  aspirin EC 81 MG tablet     Other   Mixed hyperlipidemia - Primary    He has been regularly taking Rosuvastatin 20mg  daily (Crestor) but expressed concerns about the cost of the medication. We will refill the Rosuvastatin prescription to [facility], provide him with a coupon to reduce the cost, and check CMP, CBC, lipid panel today.       Relevant Medications   tadalafil (CIALIS) 5 MG tablet   rosuvastatin (CRESTOR) 20 MG tablet   aspirin EC 81 MG tablet   Other Relevant Orders   CBC with Differential/Platelet   Comprehensive metabolic panel   Lipid panel    Return in about 8 months (around 01/27/2024) for CPE.    Gerre Scull, NP

## 2023-06-29 ENCOUNTER — Encounter: Payer: Self-pay | Admitting: Nurse Practitioner

## 2023-06-29 MED ORDER — ROSUVASTATIN CALCIUM 20 MG PO TABS: 20.0000 mg | ORAL_TABLET | Freq: Every day | ORAL | 2 refills | Status: DC

## 2023-07-21 NOTE — Assessment & Plan Note (Signed)
-  Discovered on routine physical exam and lab test since 2023, platelet has been around 600 K -No personal or family history of thrombosis, stroke or heart attack. -Molecular testing of MPN panel showed positive JAK2 V617Fmutation, which supports ET -Due to his young age and mild thrombocytosis, no previous history of thrombosis, I do not recommend treatment with Hydrea for now.  Will continue monitoring, I recommend him to start aspirin 81 mg daily.

## 2023-07-22 ENCOUNTER — Inpatient Hospital Stay: Payer: Commercial Managed Care - PPO | Admitting: Hematology

## 2023-07-22 ENCOUNTER — Inpatient Hospital Stay: Payer: Commercial Managed Care - PPO | Attending: Hematology

## 2023-07-22 ENCOUNTER — Encounter: Payer: Self-pay | Admitting: Hematology

## 2023-07-22 ENCOUNTER — Other Ambulatory Visit: Payer: Self-pay

## 2023-07-22 VITALS — BP 131/88 | HR 65 | Temp 98.5°F | Resp 15 | Wt 151.0 lb

## 2023-07-22 DIAGNOSIS — D473 Essential (hemorrhagic) thrombocythemia: Secondary | ICD-10-CM

## 2023-07-22 DIAGNOSIS — D75839 Thrombocytosis, unspecified: Secondary | ICD-10-CM | POA: Diagnosis not present

## 2023-07-22 LAB — CBC WITH DIFFERENTIAL (CANCER CENTER ONLY)
Abs Immature Granulocytes: 0.02 10*3/uL (ref 0.00–0.07)
Basophils Absolute: 0.1 10*3/uL (ref 0.0–0.1)
Basophils Relative: 1 %
Eosinophils Absolute: 0.3 10*3/uL (ref 0.0–0.5)
Eosinophils Relative: 4 %
HCT: 46.1 % (ref 39.0–52.0)
Hemoglobin: 14.8 g/dL (ref 13.0–17.0)
Immature Granulocytes: 0 %
Lymphocytes Relative: 53 %
Lymphs Abs: 4 10*3/uL (ref 0.7–4.0)
MCH: 24.3 pg — ABNORMAL LOW (ref 26.0–34.0)
MCHC: 32.1 g/dL (ref 30.0–36.0)
MCV: 75.6 fL — ABNORMAL LOW (ref 80.0–100.0)
Monocytes Absolute: 0.6 10*3/uL (ref 0.1–1.0)
Monocytes Relative: 8 %
Neutro Abs: 2.5 10*3/uL (ref 1.7–7.7)
Neutrophils Relative %: 34 %
Platelet Count: 537 10*3/uL — ABNORMAL HIGH (ref 150–400)
RBC: 6.1 MIL/uL — ABNORMAL HIGH (ref 4.22–5.81)
RDW: 14.7 % (ref 11.5–15.5)
WBC Count: 7.4 10*3/uL (ref 4.0–10.5)
nRBC: 0 % (ref 0.0–0.2)

## 2023-07-22 LAB — CMP (CANCER CENTER ONLY)
ALT: 17 U/L (ref 0–44)
AST: 18 U/L (ref 15–41)
Albumin: 4.4 g/dL (ref 3.5–5.0)
Alkaline Phosphatase: 48 U/L (ref 38–126)
Anion gap: 5 (ref 5–15)
BUN: 15 mg/dL (ref 6–20)
CO2: 31 mmol/L (ref 22–32)
Calcium: 9.5 mg/dL (ref 8.9–10.3)
Chloride: 104 mmol/L (ref 98–111)
Creatinine: 1.04 mg/dL (ref 0.61–1.24)
GFR, Estimated: >60 mL/min (ref >60)
Glucose, Bld: 82 mg/dL (ref 70–99)
Potassium: 4 mmol/L (ref 3.5–5.1)
Sodium: 140 mmol/L (ref 135–145)
Total Bilirubin: 0.5 mg/dL (ref 0.0–1.2)
Total Protein: 7.4 g/dL (ref 6.5–8.1)

## 2023-07-22 NOTE — Progress Notes (Signed)
[facility]   [phone] [phone]   Clinic Follow up Note   Patient Care Team: Gerre Scull, NP as PCP - General (Internal Medicine) Gerre Scull, NP as Nurse Practitioner (Internal Medicine)  Date of Service:  07/22/2023  CHIEF COMPLAINT: f/u of ET  CURRENT THERAPY:  Surveillance  Oncology History   Essential thrombocythemia [facility]) -Discovered on routine physical exam and lab test since 2023, platelet has been around 600 K -No personal or family history of thrombosis, stroke or heart attack. -Molecular testing of MPN panel showed positive JAK2 V617Fmutation, which supports ET -Due to his young age and mild thrombocytosis, no previous history of thrombosis, I do not recommend treatment with Hydrea for now.  Will continue monitoring, I recommend him to start aspirin 81 mg daily.    Assessment and Plan    Essential Thrombocythemia (ET) ET with fluctuating platelet counts, currently at 537 x10^9/L, down from ~600 x10^9/L. Asymptomatic with no high-risk factors for thrombotic events. Managed with low-dose aspirin. Discussed low thrombotic risk at current age, increasing above 61. Plan to monitor unless significant platelet increase or thrombotic events occur. Informed about medication necessity if experiencing thrombotic events. - Continue low-dose aspirin - Monitor platelet counts with annual labs - Follow up in one year - Contact if platelet count exceeds 600 x10^9/L or if thrombotic events occur  General Health Maintenance Maintaining healthy lifestyle with regular exercise and weight management. On medications for cholesterol and prostate health, managed by primary care physician. - Continue Crestor (rosuvastatin), finasteride, tadalafil - Maintain regular exercise and weight management - Follow up with primary care physician for annual labs and general health maintenance.     Plan -Lab reviewed, stable -Continue baby aspirin  and lab monitoring.  Next with his PCP in 6 months -Lab and follow-up in 1 year     Discussed the use of AI scribe software for clinical note transcription with the patient, who gave verbal consent to proceed.  History of Present Illness   A 53 year old patient with a known diagnosis of thrombocytosis and Essential Thrombocythemia (ET) presents for a routine follow-up. The patient reports recent clearance from prostate cancer after undergoing a biopsy, which he describes as an unpleasant procedure. He is relieved that it was not prostate cancer. He is scheduled to follow up with his urologist in six months to a year. The patient's platelet count has been fluctuating, with recent counts around 600, half the time in the 500s and half the time in the 600s. The patient reports taking baby aspirin, which he believes may be contributing to the lower platelet count. He also mentions taking Crestor, finasteride, and tadalafil for his cholesterol and prostate health, respectively. The patient is active and exercises regularly at the gym. He is trying to maintain his weight around 145-150 pounds.         All other systems were reviewed with the patient and are negative.  MEDICAL HISTORY:  Past Medical History:  Diagnosis Date   Hearing loss    left ear    SURGICAL HISTORY: Past Surgical History:  Procedure Laterality Date   HAIR TRANSPLANT     INNER EAR SURGERY      I have reviewed the social history and family history with the patient and they are unchanged from previous note.  ALLERGIES:  has no known allergies.  MEDICATIONS:  Current Outpatient Medications  Medication Sig Dispense Refill   aspirin EC 81 MG tablet Take 81 mg by mouth  daily. Swallow whole.     finasteride (PROSCAR) 5 MG tablet Take 5 mg by mouth daily.     rosuvastatin (CRESTOR) 20 MG tablet Take 1 tablet (20 mg total) by mouth daily. 30 tablet 2   tadalafil (CIALIS) 5 MG tablet Take 5 mg by mouth as needed.     No  current facility-administered medications for this visit.    PHYSICAL EXAMINATION: ECOG PERFORMANCE STATUS: 0 - Asymptomatic  Vitals:   07/22/23 1441  BP: 131/88  Pulse: 65  Resp: 15  Temp: 98.5 F (36.9 C)  SpO2: 100%   Wt Readings from Last 3 Encounters:  07/22/23 151 lb (68.5 kg)  05/29/23 146 lb (66.2 kg)  02/18/23 144 lb 12.8 oz (65.7 kg)     GENERAL:alert, no distress and comfortable SKIN: skin color, texture, turgor are normal, no rashes or significant lesions EYES: normal, Conjunctiva are pink and non-injected, sclera clear Musculoskeletal:no cyanosis of digits and no clubbing  NEURO: alert & oriented x 3 with fluent speech, no focal motor/sensory deficits   LABORATORY DATA:  I have reviewed the data as listed    Latest Ref Rng & Units 07/22/2023    2:27 PM 05/29/2023   10:16 AM 02/18/2023    3:50 PM  CBC  WBC 4.0 - 10.5 K/uL 7.4  7.7  7.8   Hemoglobin 13.0 - 17.0 g/dL 16.1  09.6  04.5   Hematocrit 39.0 - 52.0 % 46.1  46.6  45.7   Platelets 150 - 400 K/uL 537  618.0  592         Latest Ref Rng & Units 07/22/2023    2:27 PM 05/29/2023   10:16 AM 01/16/2023   10:24 AM  CMP  Glucose 70 - 99 mg/dL 82  88  77   BUN 6 - 20 mg/dL 15  14  15    Creatinine 0.61 - 1.24 mg/dL 4.09  8.11  9.14   Sodium 135 - 145 mmol/L 140  138  138   Potassium 3.5 - 5.1 mmol/L 4.0  3.9  4.6   Chloride 98 - 111 mmol/L 104  103  101   CO2 22 - 32 mmol/L 31  29  28    Calcium 8.9 - 10.3 mg/dL 9.5  9.3  78.2   Total Protein 6.5 - 8.1 g/dL 7.4  7.4  7.0   Total Bilirubin 0.0 - 1.2 mg/dL 0.5  0.6  0.5   Alkaline Phos 38 - 126 U/L 48  53  55   AST 15 - 41 U/L 18  21  18    ALT 0 - 44 U/L 17  16  15        RADIOGRAPHIC STUDIES: I have personally reviewed the radiological images as listed and agreed with the findings in the report. No results found.    No orders of the defined types were placed in this encounter.  All questions were answered. The patient knows to call the clinic  with any problems, questions or concerns. No barriers to learning was detected. The total time spent in the appointment was 15 minutes.     Malachy Mood, MD 07/22/2023

## 2023-10-28 ENCOUNTER — Other Ambulatory Visit: Payer: Self-pay | Admitting: Nurse Practitioner

## 2023-10-28 NOTE — Telephone Encounter (Signed)
 Requesting: Rosuvastatin  Calcium  Oral Tablet 20 MG  Last Visit: 05/29/2023 Next Visit: 01/29/2024 Last Refill: 06/29/2023  Please Advise

## 2023-12-29 ENCOUNTER — Encounter: Payer: Self-pay | Admitting: Nurse Practitioner

## 2024-01-22 ENCOUNTER — Encounter: Payer: Commercial Managed Care - PPO | Admitting: Nurse Practitioner

## 2024-01-29 ENCOUNTER — Encounter: Payer: Self-pay | Admitting: Nurse Practitioner

## 2024-01-29 ENCOUNTER — Ambulatory Visit: Payer: Commercial Managed Care - PPO | Admitting: Nurse Practitioner

## 2024-01-29 VITALS — BP 132/86 | HR 72 | Temp 97.3°F | Ht 66.0 in | Wt 154.8 lb

## 2024-01-29 DIAGNOSIS — D473 Essential (hemorrhagic) thrombocythemia: Secondary | ICD-10-CM

## 2024-01-29 DIAGNOSIS — N401 Enlarged prostate with lower urinary tract symptoms: Secondary | ICD-10-CM | POA: Diagnosis not present

## 2024-01-29 DIAGNOSIS — R5383 Other fatigue: Secondary | ICD-10-CM

## 2024-01-29 DIAGNOSIS — D582 Other hemoglobinopathies: Secondary | ICD-10-CM | POA: Diagnosis not present

## 2024-01-29 DIAGNOSIS — R3911 Hesitancy of micturition: Secondary | ICD-10-CM | POA: Diagnosis not present

## 2024-01-29 DIAGNOSIS — Z0000 Encounter for general adult medical examination without abnormal findings: Secondary | ICD-10-CM | POA: Diagnosis not present

## 2024-01-29 DIAGNOSIS — E782 Mixed hyperlipidemia: Secondary | ICD-10-CM

## 2024-01-29 LAB — LIPID PANEL
Cholesterol: 156 mg/dL (ref 0–200)
HDL: 38.2 mg/dL — ABNORMAL LOW (ref >39.00)
LDL Cholesterol: 73 mg/dL (ref 0–99)
NonHDL: 117.93
Total CHOL/HDL Ratio: 4
Triglycerides: 224 mg/dL — ABNORMAL HIGH (ref 0.0–149.0)
VLDL: 44.8 mg/dL — ABNORMAL HIGH (ref 0.0–40.0)

## 2024-01-29 LAB — CBC WITH DIFFERENTIAL/PLATELET
Basophils Absolute: 0.1 K/uL (ref 0.0–0.1)
Basophils Relative: 0.9 % (ref 0.0–3.0)
Eosinophils Absolute: 0.2 K/uL (ref 0.0–0.7)
Eosinophils Relative: 3.5 % (ref 0.0–5.0)
HCT: 45.5 % (ref 39.0–52.0)
Hemoglobin: 14.7 g/dL (ref 13.0–17.0)
Lymphocytes Relative: 45.7 % (ref 12.0–46.0)
Lymphs Abs: 2.9 K/uL (ref 0.7–4.0)
MCHC: 32.4 g/dL (ref 30.0–36.0)
MCV: 74.3 fl — ABNORMAL LOW (ref 78.0–100.0)
Monocytes Absolute: 0.5 K/uL (ref 0.1–1.0)
Monocytes Relative: 8 % (ref 3.0–12.0)
Neutro Abs: 2.7 K/uL (ref 1.4–7.7)
Neutrophils Relative %: 41.9 % — ABNORMAL LOW (ref 43.0–77.0)
Platelets: 544 K/uL — ABNORMAL HIGH (ref 150.0–400.0)
RBC: 6.12 Mil/uL — ABNORMAL HIGH (ref 4.22–5.81)
RDW: 14.8 % (ref 11.5–15.5)
WBC: 6.4 K/uL (ref 4.0–10.5)

## 2024-01-29 LAB — COMPREHENSIVE METABOLIC PANEL WITH GFR
ALT: 14 U/L (ref 0–53)
AST: 18 U/L (ref 0–37)
Albumin: 4.4 g/dL (ref 3.5–5.2)
Alkaline Phosphatase: 45 U/L (ref 39–117)
BUN: 16 mg/dL (ref 6–23)
CO2: 28 meq/L (ref 19–32)
Calcium: 9.2 mg/dL (ref 8.4–10.5)
Chloride: 102 meq/L (ref 96–112)
Creatinine, Ser: 0.97 mg/dL (ref 0.40–1.50)
GFR: 90.03 mL/min (ref >60.00)
Glucose, Bld: 80 mg/dL (ref 70–99)
Potassium: 4.4 meq/L (ref 3.5–5.1)
Sodium: 139 meq/L (ref 135–145)
Total Bilirubin: 0.7 mg/dL (ref 0.2–1.2)
Total Protein: 6.9 g/dL (ref 6.0–8.3)

## 2024-01-29 LAB — TSH: TSH: 0.95 u[IU]/mL (ref 0.35–5.50)

## 2024-01-29 LAB — TESTOSTERONE: Testosterone: 704.2 ng/dL (ref 300.00–890.00)

## 2024-01-29 LAB — PSA: PSA: 4.17 ng/mL — ABNORMAL HIGH (ref 0.10–4.00)

## 2024-01-29 MED ORDER — ROSUVASTATIN CALCIUM 20 MG PO TABS: 20.0000 mg | ORAL_TABLET | Freq: Every day | ORAL | 3 refills | Status: AC

## 2024-01-29 NOTE — Assessment & Plan Note (Signed)
Chronic, stable.  Continue rosuvastatin 20 mg daily.  Check CMP, CBC, lipid panel today.

## 2024-01-29 NOTE — Patient Instructions (Signed)
 It was great to see you!  We are checking your labs today and will let you know the results via mychart/phone.   Let me know when you need any refills   Let's follow-up in 1 year, sooner if you have concerns.  If a referral was placed today, you will be contacted for an appointment. Please note that routine referrals can sometimes take up to 3-4 weeks to process. Please call our office if you haven't heard anything after this time frame.  Take care,  Tinnie Harada, NP

## 2024-01-29 NOTE — Progress Notes (Signed)
 BP 132/86 (BP Location: Left Arm, Cuff Size: Normal)   Pulse 72   Temp (!) 97.3 F (36.3 C) (Tympanic)   Ht 5' 6 (1.676 m)   Wt 154 lb 12.8 oz (70.2 kg)   SpO2 99%   BMI 24.99 kg/m    Subjective:    Patient ID: Anthony Bowers, male    DOB: 02/15/1971, 53 y.o.   MRN: [id number]  CC: Chief Complaint  Patient presents with   Annual Exam    With fasting labs, no concerns    HPI: Anthony Bowers is a 53 y.o. male presenting on 01/29/2024 for comprehensive medical examination. Current medical complaints include:none  Depression and Anxiety Screen done today and results listed below:     01/29/2024   10:10 AM 01/16/2023    9:51 AM 11/29/2021   10:10 AM  Depression screen PHQ 2/9  Decreased Interest 0 0 0  Down, Depressed, Hopeless 0 0 1  PHQ - 2 Score 0 0 1  Altered sleeping 0 0   Tired, decreased energy 0 0   Change in appetite 0 0   Feeling bad or failure about yourself  0 0   Trouble concentrating 0 0   Moving slowly or fidgety/restless 0 0   Suicidal thoughts 0 0   PHQ-9 Score 0 0   Difficult doing work/chores Not difficult at all        01/29/2024   10:11 AM 01/16/2023    9:51 AM  GAD 7 : Generalized Anxiety Score  Nervous, Anxious, on Edge 0 0  Control/stop worrying 0 0  Worry too much - different things 0 0  Trouble relaxing 0 0  Restless 0 0  Easily annoyed or irritable 0 0  Afraid - awful might happen 0 0  Total GAD 7 Score 0 0  Anxiety Difficulty Not difficult at all     The patient does not have a history of falls. I did not complete a risk assessment for falls. A plan of care for falls was not documented.   Past Medical History:  Past Medical History:  Diagnosis Date   BPH (benign prostatic hyperplasia)    Essential thrombocytopenia (HCC)    Hearing loss    left ear   Hyperlipidemia     Surgical History:  Past Surgical History:  Procedure Laterality Date   HAIR TRANSPLANT     INNER EAR SURGERY      Medications:  Current Outpatient  Medications on File Prior to Visit  Medication Sig   aspirin EC 81 MG tablet Take 81 mg by mouth daily. Swallow whole.   finasteride (PROSCAR) 5 MG tablet Take 5 mg by mouth daily.   loratadine (CLARITIN REDITABS) 10 MG dissolvable tablet Take 10 mg by mouth daily.   tadalafil (CIALIS) 5 MG tablet Take 5 mg by mouth as needed.   No current facility-administered medications on file prior to visit.    Allergies:  No Known Allergies  Social History:  Social History   Socioeconomic History   Marital status: Legally Separated    Spouse name: Not on file   Number of children: 1   Years of education: Not on file   Highest education level: Some college, no degree  Occupational History   Not on file  Tobacco Use   Smoking status: Former    Current packs/day: 0.00    Types: Cigarettes    Quit date: 2016    Years since quitting: 9.6   Smokeless tobacco: Never  Vaping Use   Vaping status: Never Used  Substance and Sexual Activity   Alcohol use: Yes    Alcohol/week: 6.0 standard drinks of alcohol    Types: 3 Glasses of wine, 3 Cans of beer per week    Comment: socially   Drug use: Never   Sexual activity: Not Currently  Other Topics Concern   Not on file  Social History Narrative   Not on file   Social Drivers of Health   Financial Resource Strain: Low Risk  (01/28/2024)   Overall Financial Resource Strain (CARDIA)    Difficulty of Paying Living Expenses: Not hard at all  Food Insecurity: No Food Insecurity (01/28/2024)   Hunger Vital Sign    Worried About Running Out of Food in the Last Year: Never true    Ran Out of Food in the Last Year: Never true  Transportation Needs: No Transportation Needs (01/28/2024)   PRAPARE - Administrator, Civil Service (Medical): No    Lack of Transportation (Non-Medical): No  Physical Activity: Sufficiently Active (01/28/2024)   Exercise Vital Sign    Days of Exercise per Week: 4 days    Minutes of Exercise per Session: 60 min   Stress: No Stress Concern Present (01/28/2024)   [employer] of Occupational Health - Occupational Stress Questionnaire    Feeling of Stress: Not at all  Social Connections: Moderately Isolated (01/28/2024)   Social Connection and Isolation Panel    Frequency of Communication with Friends and Family: More than three times a week    Frequency of Social Gatherings with Friends and Family: Once a week    Attends Religious Services: More than 4 times per year    Active Member of [employer] or Organizations: No    Attends Engineer, structural: Not on file    Marital Status: Separated  Intimate Partner Violence: Not on file   Social History   Tobacco Use  Smoking Status Former   Current packs/day: 0.00   Types: Cigarettes   Quit date: 2016   Years since quitting: 9.6  Smokeless Tobacco Never   Social History   Substance and Sexual Activity  Alcohol Use Yes   Alcohol/week: 6.0 standard drinks of alcohol   Types: 3 Glasses of wine, 3 Cans of beer per week   Comment: socially    Family History:  Family History  Problem Relation Age of Onset   Hypertension Mother    Hearing loss Mother    Cancer Father        lung    Past medical history, surgical history, medications, allergies, family history and social history reviewed with patient today and changes made to appropriate areas of the chart.   Review of Systems  Constitutional:  Positive for malaise/fatigue. Negative for fever.  HENT: Negative.    Eyes: Negative.   Respiratory: Negative.    Cardiovascular: Negative.   Gastrointestinal: Negative.   Genitourinary: Negative.   Musculoskeletal: Negative.   Skin: Negative.   Neurological: Negative.   Psychiatric/Behavioral: Negative.     All other ROS negative except what is listed above and in the HPI.      Objective:    BP 132/86 (BP Location: Left Arm, Cuff Size: Normal)   Pulse 72   Temp (!) 97.3 F (36.3 C) (Tympanic)   Ht 5' 6 (1.676 m)   Wt 154 lb  12.8 oz (70.2 kg)   SpO2 99%   BMI 24.99 kg/m   Wt Readings from Last  3 Encounters:  01/29/24 154 lb 12.8 oz (70.2 kg)  07/22/23 151 lb (68.5 kg)  05/29/23 146 lb (66.2 kg)    Physical Exam Vitals and nursing note reviewed.  Constitutional:      General: He is not in acute distress.    Appearance: Normal appearance.  HENT:     Head: Normocephalic and atraumatic.     Right Ear: Tympanic membrane, ear canal and external ear normal.     Left Ear: Tympanic membrane, ear canal and external ear normal.     Mouth/Throat:     Mouth: Mucous membranes are moist.     Pharynx: No posterior oropharyngeal erythema.  Eyes:     Conjunctiva/sclera: Conjunctivae normal.  Cardiovascular:     Rate and Rhythm: Normal rate and regular rhythm.     Pulses: Normal pulses.     Heart sounds: Normal heart sounds.  Pulmonary:     Effort: Pulmonary effort is normal.     Breath sounds: Normal breath sounds.  Abdominal:     Palpations: Abdomen is soft.     Tenderness: There is no abdominal tenderness.  Musculoskeletal:        General: Normal range of motion.     Cervical back: Normal range of motion and neck supple. No tenderness.     Right lower leg: No edema.     Left lower leg: No edema.  Lymphadenopathy:     Cervical: No cervical adenopathy.  Skin:    General: Skin is warm and dry.  Neurological:     General: No focal deficit present.     Mental Status: He is alert and oriented to person, place, and time.     Cranial Nerves: No cranial nerve deficit.     Coordination: Coordination normal.     Gait: Gait normal.  Psychiatric:        Mood and Affect: Mood normal.        Behavior: Behavior normal.        Thought Content: Thought content normal.        Judgment: Judgment normal.     Results for orders placed or performed in visit on 07/22/23  CMP (Cancer Center only)   Collection Time: 07/22/23  2:27 PM  Result Value Ref Range   Sodium 140 135 - 145 mmol/L   Potassium 4.0 3.5 - 5.1  mmol/L   Chloride 104 98 - 111 mmol/L   CO2 31 22 - 32 mmol/L   Glucose, Bld 82 70 - 99 mg/dL   BUN 15 6 - 20 mg/dL   Creatinine 8.95 9.38 - 1.24 mg/dL   Calcium  9.5 8.9 - 10.3 mg/dL   Total Protein 7.4 6.5 - 8.1 g/dL   Albumin 4.4 3.5 - 5.0 g/dL   AST 18 15 - 41 U/L   ALT 17 0 - 44 U/L   Alkaline Phosphatase 48 38 - 126 U/L   Total Bilirubin 0.5 0.0 - 1.2 mg/dL   GFR, Estimated >39 >39 mL/min   Anion gap 5 5 - 15  CBC with Differential (Cancer Center Only)   Collection Time: 07/22/23  2:27 PM  Result Value Ref Range   WBC Count 7.4 4.0 - 10.5 K/uL   RBC 6.10 (H) 4.22 - 5.81 MIL/uL   Hemoglobin 14.8 13.0 - 17.0 g/dL   HCT 53.8 60.9 - 47.9 %   MCV 75.6 (L) 80.0 - 100.0 fL   MCH 24.3 (L) 26.0 - 34.0 pg   MCHC 32.1 30.0 - 36.0  g/dL   RDW 85.2 88.4 - 84.4 %   Platelet Count 537 (H) 150 - 400 K/uL   nRBC 0.0 0.0 - 0.2 %   Neutrophils Relative % 34 %   Neutro Abs 2.5 1.7 - 7.7 K/uL   Lymphocytes Relative 53 %   Lymphs Abs 4.0 0.7 - 4.0 K/uL   Monocytes Relative 8 %   Monocytes Absolute 0.6 0.1 - 1.0 K/uL   Eosinophils Relative 4 %   Eosinophils Absolute 0.3 0.0 - 0.5 K/uL   Basophils Relative 1 %   Basophils Absolute 0.1 0.0 - 0.1 K/uL   Immature Granulocytes 0 %   Abs Immature Granulocytes 0.02 0.00 - 0.07 K/uL      Assessment & Plan:   Problem List Items Addressed This Visit       Genitourinary   Benign prostatic hyperplasia with urinary hesitancy   Chronic, stable. Finasteride 5mg  daily and Tadalafil 5mg  daily have been helping. He will continue his current medications and have an annual follow-up with his urologist.      Relevant Orders   PSA     Hematopoietic and Hemostatic   Essential thrombocythemia (HCC)   Chronic, stable. Currently taking aspirin 81mg  every other day. Check CBC today.       Relevant Orders   CBC with Differential/Platelet     Other   Mixed hyperlipidemia   Chronic, stable. Continue rosuvastatin  20mg  daily. Check CMP, CBC, lipid  panel today.       Relevant Medications   rosuvastatin  (CRESTOR ) 20 MG tablet   Other Relevant Orders   CBC with Differential/Platelet   Comprehensive metabolic panel with GFR   Lipid panel   Routine general medical examination at a health care facility - Primary   Health maintenance reviewed and updated. Discussed nutrition, exercise.  Follow-up 1 year.        Hemoglobin E trait (HCC)   Hemoglobin stable. Will continue to monitor yearly. Check CBC today.       Relevant Orders   CBC with Differential/Platelet   Other Visit Diagnoses       Fatigue, unspecified type       Check TSH, testosterone  today. Recommend regular exercise and sleep   Relevant Orders   TSH   Testosterone         LABORATORY TESTING:  Health maintenance labs ordered today as discussed above.   The natural history of prostate cancer and ongoing controversy regarding screening and potential treatment outcomes of prostate cancer has been discussed with the patient. The meaning of a false positive PSA and a false negative PSA has been discussed. He indicates understanding of the limitations of this screening test and wishes to proceed with screening PSA testing.   IMMUNIZATIONS:   - Tdap: Tetanus vaccination status reviewed: last tetanus booster within 10 years. - Influenza: Postponed to flu season - Pneumovax: Not applicable - Prevnar: Declined - HPV: Not applicable - Shingrix  vaccine: Declined  SCREENING: - Colonoscopy: Declined  Discussed with patient purpose of the colonoscopy is to detect colon cancer at curable precancerous or early stages   - AAA Screening: Not applicable   PATIENT COUNSELING:    Sexuality: Discussed sexually transmitted diseases, partner selection, use of condoms, avoidance of unintended pregnancy  and contraceptive alternatives.   Advised to avoid cigarette smoking.  I discussed with the patient that most people either abstain from alcohol or drink within safe limits  (<=14/week and <=4 drinks/occasion for males, <=7/weeks and <= 3 drinks/occasion for females) and  that the risk for alcohol disorders and other health effects rises proportionally with the number of drinks per week and how often a drinker exceeds daily limits.  Discussed cessation/primary prevention of drug use and availability of treatment for abuse.   Diet: Encouraged to adjust caloric intake to maintain  or achieve ideal body weight, to reduce intake of dietary saturated fat and total fat, to limit sodium intake by avoiding high sodium foods and not adding table salt, and to maintain adequate dietary potassium and calcium  preferably from fresh fruits, vegetables, and low-fat dairy products.    stressed the importance of regular exercise  Injury prevention: Discussed safety belts, safety helmets, smoke detector, smoking near bedding or upholstery.   Dental health: Discussed importance of regular tooth brushing, flossing, and dental visits.   Follow up plan: NEXT PREVENTATIVE PHYSICAL DUE IN 1 YEAR. Return in about 1 year (around 01/28/2025) for CPE.  Cono Gebhard A Lorianne Malbrough

## 2024-01-29 NOTE — Assessment & Plan Note (Signed)
 Chronic, stable. Currently taking aspirin 81mg  every other day. Check CBC today.

## 2024-01-29 NOTE — Assessment & Plan Note (Signed)
 Hemoglobin stable. Will continue to monitor yearly. Check CBC today.

## 2024-01-29 NOTE — Assessment & Plan Note (Signed)
 Chronic, stable. Finasteride 5mg  daily and Tadalafil 5mg  daily have been helping. He will continue his current medications and have an annual follow-up with his urologist.

## 2024-01-29 NOTE — Assessment & Plan Note (Signed)
Health maintenance reviewed and updated. Discussed nutrition, exercise. Follow-up 1 year.

## 2024-02-01 ENCOUNTER — Ambulatory Visit: Payer: Self-pay | Admitting: Nurse Practitioner

## 2024-07-21 ENCOUNTER — Inpatient Hospital Stay: Payer: Commercial Managed Care - PPO | Admitting: Hematology

## 2024-07-21 ENCOUNTER — Other Ambulatory Visit: Payer: Self-pay

## 2024-07-21 ENCOUNTER — Inpatient Hospital Stay: Payer: Commercial Managed Care - PPO | Attending: Hematology

## 2024-07-21 VITALS — BP 138/89 | HR 73 | Temp 97.5°F | Resp 17 | Wt 156.0 lb

## 2024-07-21 DIAGNOSIS — D473 Essential (hemorrhagic) thrombocythemia: Secondary | ICD-10-CM

## 2024-07-21 LAB — CMP (CANCER CENTER ONLY)
ALT: 16 U/L (ref 0–44)
AST: 19 U/L (ref 15–41)
Albumin: 4.5 g/dL (ref 3.5–5.0)
Alkaline Phosphatase: 63 U/L (ref 38–126)
Anion gap: 11 (ref 5–15)
BUN: 12 mg/dL (ref 6–20)
CO2: 25 mmol/L (ref 22–32)
Calcium: 9.1 mg/dL (ref 8.9–10.3)
Chloride: 103 mmol/L (ref 98–111)
Creatinine: 0.93 mg/dL (ref 0.61–1.24)
GFR, Estimated: >60 mL/min
Glucose, Bld: 115 mg/dL — ABNORMAL HIGH (ref 70–99)
Potassium: 4 mmol/L (ref 3.5–5.1)
Sodium: 139 mmol/L (ref 135–145)
Total Bilirubin: 0.3 mg/dL (ref 0.0–1.2)
Total Protein: 7.2 g/dL (ref 6.5–8.1)

## 2024-07-21 LAB — CBC WITH DIFFERENTIAL (CANCER CENTER ONLY)
Abs Immature Granulocytes: 0.02 10*3/uL (ref 0.00–0.07)
Basophils Absolute: 0.1 10*3/uL (ref 0.0–0.1)
Basophils Relative: 1 %
Eosinophils Absolute: 0.3 10*3/uL (ref 0.0–0.5)
Eosinophils Relative: 4 %
HCT: 43.9 % (ref 39.0–52.0)
Hemoglobin: 14.6 g/dL (ref 13.0–17.0)
Immature Granulocytes: 0 %
Lymphocytes Relative: 46 %
Lymphs Abs: 3.3 10*3/uL (ref 0.7–4.0)
MCH: 24.3 pg — ABNORMAL LOW (ref 26.0–34.0)
MCHC: 33.3 g/dL (ref 30.0–36.0)
MCV: 73.2 fL — ABNORMAL LOW (ref 80.0–100.0)
Monocytes Absolute: 0.6 10*3/uL (ref 0.1–1.0)
Monocytes Relative: 8 %
Neutro Abs: 3 10*3/uL (ref 1.7–7.7)
Neutrophils Relative %: 41 %
Platelet Count: 561 10*3/uL — ABNORMAL HIGH (ref 150–400)
RBC: 6 MIL/uL — ABNORMAL HIGH (ref 4.22–5.81)
RDW: 14.8 % (ref 11.5–15.5)
WBC Count: 7.2 10*3/uL (ref 4.0–10.5)
nRBC: 0 % (ref 0.0–0.2)

## 2024-07-21 NOTE — Assessment & Plan Note (Signed)
 -  Discovered on routine physical exam and lab test since 2023, platelet has been around 600 K -No personal or family history of thrombosis, stroke or heart attack. -Molecular testing of MPN panel showed positive JAK2 V617Fmutation, which supports ET -Due to his young age and mild thrombocytosis, no previous history of thrombosis, I do not recommend treatment with Hydrea for now.  Will continue monitoring, I recommend him to start aspirin 81 mg daily.

## 2024-07-21 NOTE — Progress Notes (Signed)
 " [facility]   [phone] [phone]   Clinic Follow up Note   Patient Care Team: Nedra Tinnie LABOR, NP as PCP - General (Internal Medicine) Nedra Tinnie LABOR, NP as Nurse Practitioner (Internal Medicine)  Date of Service:  07/21/2024  CHIEF COMPLAINT: f/u of ET  CURRENT THERAPY:  Observation  Oncology History   Essential thrombocythemia [facility]) -Discovered on routine physical exam and lab test since 2023, platelet has been around 600 K -No personal or family history of thrombosis, stroke or heart attack. -Molecular testing of MPN panel showed positive JAK2 V617Fmutation, which supports ET -Due to his young age and mild thrombocytosis, no previous history of thrombosis, I do not recommend treatment with Hydrea for now.  Will continue monitoring, I recommend him to start aspirin 81 mg daily.  Assessment & Plan Essential thrombocythemia Persistently elevated platelet counts in the high 500,000s to low 600,000s. He remains asymptomatic from a thrombotic standpoint, physically active, and without history of thrombosis or cardiovascular disease. He experienced a right ocular hemorrhage, leading to reduction in aspirin frequency. He is low risk for thrombotic events due to age and absence of symptoms; cytoreductive therapy is not indicated. Prognosis is favorable, though risk may increase with age or higher platelet counts. - Continued observation without cytoreductive therapy. - Continued low-dose aspirin to reduce thrombotic risk; advised to reduce frequency or hold aspirin if bleeding occurs. - Annual laboratory monitoring of platelet count by primary care physician. - Follow-up in two years unless platelet count exceeds 600 x10^9/L or thrombotic events occur.   Plan - He is clinically doing well, lab reviewed, mild thrombocytopenia is stable - Continue follow-up with his PCP annually in July, with lab including CBC - Follow-up with me in 2 years -  Continue aspirin 81mg  every other day as he tolerates.    Discussed the use of AI scribe software for clinical note transcription with the patient, who gave verbal consent to proceed.  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  He was diagnosed with essential thrombocythemia in 2023. Platelets remain persistently elevated but stable, most recently 561,000/L, with a prior peak of 620,000/L over the past year. He stays physically active without new thrombotic symptoms or cardiovascular events.  He had a prior right eye hemorrhage and reduced low-dose aspirin to every other day or as needed. He has had no recurrent bleeding and is not taking aspirin regularly.  He has no new symptoms, including thrombosis, chest pain, dyspnea, or other cardiovascular complaints. He feels well aside from mild sleepiness, and there have been no significant health changes since his last hematology visit one year ago.  He alternates annual follow-up between hematology and primary care, with interim laboratory monitoring by his primary care physician.  Jul 22, 2023: Follow-up for essential thrombocythemia (ET); platelet count stable at 537 x10^9/L, JAK2 V617F mutation confirmed, managed with low-dose aspirin and annual lab monitoring. No symptoms or high-risk factors for thrombosis; Hydrea not recommended. Recent negative prostate cancer biopsy, plans for urology follow-up in 6-12 months.     All other systems were reviewed with the patient and are negative.  MEDICAL HISTORY:  Past Medical History:  Diagnosis Date   BPH (benign prostatic hyperplasia)    Essential thrombocytopenia (HCC)    Hearing loss    left ear   Hyperlipidemia     SURGICAL HISTORY:  Past Surgical History:  Procedure Laterality Date   HAIR  TRANSPLANT     INNER EAR SURGERY      I have reviewed the social history and family history with the patient and they are unchanged from previous note.  ALLERGIES:  has no known allergies.  MEDICATIONS:  Current Outpatient Medications  Medication Sig Dispense Refill   aspirin EC 81 MG tablet Take 81 mg by mouth daily. Swallow whole.     finasteride (PROSCAR) 5 MG tablet Take 5 mg by mouth daily.     loratadine (CLARITIN REDITABS) 10 MG dissolvable tablet Take 10 mg by mouth daily.     rosuvastatin  (CRESTOR ) 20 MG tablet Take 1 tablet (20 mg total) by mouth daily. 90 tablet 3   tadalafil (CIALIS) 5 MG tablet Take 5 mg by mouth as needed.     No current facility-administered medications for this visit.    PHYSICAL EXAMINATION: ECOG PERFORMANCE STATUS: 0 - Asymptomatic  Vitals:   07/21/24 1513  BP: 138/89  Pulse: 73  Resp: 17  Temp: (!) 97.5 F (36.4 C)  SpO2: 98%   Wt Readings from Last 3 Encounters:  07/21/24 156 lb (70.8 kg)  01/29/24 154 lb 12.8 oz (70.2 kg)  07/22/23 151 lb (68.5 kg)     GENERAL:alert, no distress and comfortable SKIN: skin color, texture, turgor are normal, no rashes or significant lesions EYES: normal, Conjunctiva are pink and non-injected, sclera clear Musculoskeletal:no cyanosis of digits and no clubbing  NEURO: alert & oriented x 3 with fluent speech, no focal motor/sensory deficits  Physical Exam    LABORATORY DATA:  I have reviewed the data as listed    Latest Ref Rng & Units 07/21/2024    2:31 PM 01/29/2024   10:37 AM 07/22/2023    2:27 PM  CBC  WBC 4.0 - 10.5 K/uL 7.2  6.4  7.4   Hemoglobin 13.0 - 17.0 g/dL 85.3  85.2  85.1   Hematocrit 39.0 - 52.0 % 43.9  45.5  46.1   Platelets 150 - 400 K/uL 561  544.0  537         Latest Ref Rng & Units 07/21/2024    2:31 PM 01/29/2024   10:37 AM 07/22/2023    2:27 PM  CMP  Glucose 70 - 99 mg/dL 884  80  82   BUN 6 - 20 mg/dL 12  16  15    Creatinine 0.61 - 1.24 mg/dL 9.06  9.02  8.95    Sodium 135 - 145 mmol/L 139  139  140   Potassium 3.5 - 5.1 mmol/L 4.0  4.4  4.0   Chloride 98 - 111 mmol/L 103  102  104   CO2 22 - 32 mmol/L 25  28  31    Calcium  8.9 - 10.3 mg/dL 9.1  9.2  9.5   Total Protein 6.5 - 8.1 g/dL 7.2  6.9  7.4   Total Bilirubin 0.0 - 1.2 mg/dL 0.3  0.7  0.5   Alkaline Phos 38 - 126 U/L 63  45  48   AST 15 - 41 U/L 19  18  18    ALT 0 - 44 U/L 16  14  17        RADIOGRAPHIC STUDIES: I have personally reviewed the radiological images as listed and agreed with the findings in the report. No results found.    No orders of the defined types were placed in this encounter.  All questions were answered. The patient knows  to call the clinic with any problems, questions or concerns. No barriers to learning was detected. The total time spent in the appointment was 15 minutes, including review of chart and various tests results, discussions about plan of care and coordination of care plan     Onita Mattock, MD 07/21/2024     "

## 2025-02-03 ENCOUNTER — Encounter: Admitting: Nurse Practitioner

## 2025-07-21 ENCOUNTER — Inpatient Hospital Stay

## 2025-07-21 ENCOUNTER — Inpatient Hospital Stay: Admitting: Hematology
